# Patient Record
Sex: Male | Born: 2000 | Race: Black or African American | Hispanic: No | Marital: Single | Smoking: Never smoker
Health system: Southern US, Community
[De-identification: ages and names within clinical notes are randomized; demographics above are authoritative.]

---
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
## 2021-11-21 ENCOUNTER — Encounter (HOSPITAL_COMMUNITY): Payer: Self-pay | Admitting: Emergency Medicine

## 2021-11-21 ENCOUNTER — Emergency Department (HOSPITAL_COMMUNITY): Payer: Medicaid Other

## 2021-11-21 ENCOUNTER — Other Ambulatory Visit: Payer: Self-pay

## 2021-11-21 ENCOUNTER — Emergency Department (HOSPITAL_COMMUNITY)
Admission: EM | Admit: 2021-11-21 | Discharge: 2021-11-21 | Disposition: A | Discharge status: Home / Self Care | Payer: Medicaid Other | Attending: Emergency Medicine | Admitting: Emergency Medicine

## 2021-11-21 DIAGNOSIS — R1032 Left lower quadrant pain: Secondary | ICD-10-CM | POA: Diagnosis not present

## 2021-11-21 DIAGNOSIS — R319 Hematuria, unspecified: Secondary | ICD-10-CM | POA: Diagnosis present

## 2021-11-21 LAB — BASIC METABOLIC PANEL
Anion gap: 12 (ref 5–15)
BUN: 9 mg/dL (ref 6–20)
CO2: 24 mmol/L (ref 22–32)
Calcium: 9.8 mg/dL (ref 8.9–10.3)
Chloride: 103 mmol/L (ref 98–111)
Creatinine, Ser: 0.89 mg/dL (ref 0.61–1.24)
GFR, Estimated: >60 mL/min (ref >60)
Glucose, Bld: 101 mg/dL — ABNORMAL HIGH (ref 70–99)
Potassium: 3.7 mmol/L (ref 3.5–5.1)
Sodium: 139 mmol/L (ref 135–145)

## 2021-11-21 LAB — URINALYSIS, ROUTINE W REFLEX MICROSCOPIC
Bilirubin Urine: NEGATIVE
Glucose, UA: NEGATIVE mg/dL
Ketones, ur: 5 mg/dL — AB
Nitrite: NEGATIVE
Protein, ur: NEGATIVE mg/dL
Specific Gravity, Urine: 1.014 (ref 1.005–1.030)
WBC, UA: >50 WBC/hpf — ABNORMAL HIGH (ref 0–5)
pH: 6 (ref 5.0–8.0)

## 2021-11-21 LAB — CBC
HCT: 43.1 % (ref 39.0–52.0)
Hemoglobin: 14.9 g/dL (ref 13.0–17.0)
MCH: 29.9 pg (ref 26.0–34.0)
MCHC: 34.6 g/dL (ref 30.0–36.0)
MCV: 86.4 fL (ref 80.0–100.0)
Platelets: 184 10*3/uL (ref 150–400)
RBC: 4.99 MIL/uL (ref 4.22–5.81)
RDW: 12.8 % (ref 11.5–15.5)
WBC: 6.6 10*3/uL (ref 4.0–10.5)
nRBC: 0 % (ref 0.0–0.2)

## 2021-11-21 MED ORDER — CEPHALEXIN 500 MG PO CAPS: 500.0000 mg | ORAL_CAPSULE | Freq: Two times a day (BID) | ORAL | 0 refills | Status: AC

## 2021-11-21 NOTE — ED Notes (Signed)
Patient verbalizes understanding of d/c instructions. Opportunities for questions and answers were provided. Pt d/c from ED and ambulated to lobby.  

## 2021-11-21 NOTE — ED Triage Notes (Addendum)
Pt reported to ED with c/o blood in urine upon awakening this AM. States that shortly afterwards he developed pain in the left lower abdomen that radiates into back. Pt denies any nausea or vomiting, difficulty urinating, urinary frequency or burning. ?

## 2021-11-21 NOTE — Discharge Instructions (Addendum)
Please turn to the ED with any new or worsening signs or symptoms just fevers, inability to urinate, nausea, vomiting ?Please pick up antibiotics I have sent in for you ?Please follow-up with your PCP in the next week for evaluation ?In the event that you have blood in your urine again, please call the urology office that I referred you to.  Their contact information is located in this packet. ?Please read the attached informational guide concerning blood in your urine. ?

## 2021-11-21 NOTE — ED Provider Notes (Signed)
?[facility] ?Provider Note ? ? ?CSN: [id number] ?Arrival date & time: 11/21/21  1816 ? ?  ? ?History ? ?Chief Complaint  ?Patient presents with  ? Hematuria  ? ? ?Richard Medina is a 21 y.o. male with no medical history.  Patient presents to ED for evaluation of blood in urine.  Patient states that this morning he woke up, went to the bathroom to urinate and noticed at the end of his stream 3 drops of blood that came out.  Patient states that 15 minutes later he use the bathroom again and the same issue occurred.  Patient states that at this time he developed left lower quadrant pain.  The patient states the pain is known with her at present.  Patient describes her left lower quadrant pain as "lingering, ache, dull".  Patient is endorsing body aches, left-sided CVA tenderness, left lower quadrant abdominal pain. The patient denies fevers, nausea, vomiting, diarrhea, penile discharge, burning with urination. ? ? ? ? ? ?Hematuria ?Associated symptoms include abdominal pain.  ? ?  ? ?Home Medications ?Prior to Admission medications   ?Medication Sig Start Date End Date Taking? Authorizing Provider  ?cephALEXin (KEFLEX) 500 MG capsule Take 1 capsule (500 mg total) by mouth 2 (two) times daily. 11/21/21  Yes Al DecantGroce, Jacarri Gesner F, PA-C  ?acetaminophen (TYLENOL) 500 MG tablet Take 1 tablet (500 mg total) by mouth every 6 (six) hours as needed. 12/06/13   Piepenbrink, Victorino DikeJennifer, PA-C  ?ibuprofen (ADVIL,MOTRIN) 100 MG/5ML suspension Take 29.5 mLs (590 mg total) by mouth every 6 (six) hours as needed for mild pain. 11/23/14   Marcellina MillinGaley, Timothy, MD  ?triamcinolone cream (KENALOG) 0.1 % Apply 1 application topically 2 (two) times daily. X 5 days qs 09/11/14   Marcellina MillinGaley, Timothy, MD  ?   ? ?Allergies    ?Patient has no known allergies.   ? ?Review of Systems   ?Review of Systems  ?Constitutional:  Positive for chills. Negative for fever.  ?Gastrointestinal:  Positive for abdominal pain. Negative for  nausea and vomiting.  ?Genitourinary:  Positive for flank pain and hematuria. Negative for dysuria and penile discharge.  ?Musculoskeletal:  Positive for back pain.  ?All other systems reviewed and are negative. ? ?Physical Exam ?Updated Vital Signs ?BP 138/74   Pulse 65   Temp 99.5 ?F (37.5 ?C) (Oral)   Resp 12   SpO2 97%  ?Physical Exam ?Vitals and nursing note reviewed.  ?Constitutional:   ?   General: He is not in acute distress. ?   Appearance: Normal appearance. He is not ill-appearing, toxic-appearing or diaphoretic.  ?HENT:  ?   Head: Normocephalic and atraumatic.  ?   Mouth/Throat:  ?   Mouth: Mucous membranes are moist.  ?   Pharynx: Oropharynx is clear.  ?Eyes:  ?   Extraocular Movements: Extraocular movements intact.  ?   Conjunctiva/sclera: Conjunctivae normal.  ?   Pupils: Pupils are equal, round, and reactive to light.  ?Cardiovascular:  ?   Rate and Rhythm: Normal rate and regular rhythm.  ?Pulmonary:  ?   Effort: Pulmonary effort is normal.  ?   Breath sounds: Normal breath sounds. No wheezing.  ?Abdominal:  ?   General: Abdomen is flat.  ?   Palpations: Abdomen is soft.  ?   Tenderness: There is abdominal tenderness (LLQ). There is left CVA tenderness.  ?Musculoskeletal:  ?   Cervical back: Normal range of motion and neck supple. No tenderness.  ?Skin: ?  General: Skin is warm and dry.  ?   Capillary Refill: Capillary refill takes less than 2 seconds.  ?Neurological:  ?   Mental Status: He is alert and oriented to person, place, and time.  ? ? ?ED Results / Procedures / Treatments   ?Labs ?(all labs ordered are listed, but only abnormal results are displayed) ?Labs Reviewed  ?BASIC METABOLIC PANEL - Abnormal; Notable for the following components:  ?    Result Value  ? Glucose, Bld 101 (*)   ? All other components within normal limits  ?URINALYSIS, ROUTINE W REFLEX MICROSCOPIC - Abnormal; Notable for the following components:  ? APPearance HAZY (*)   ? Hgb urine dipstick SMALL (*)   ? Ketones,  ur 5 (*)   ? Leukocytes,Ua MODERATE (*)   ? WBC, UA >50 (*)   ? Bacteria, UA FEW (*)   ? All other components within normal limits  ?URINE CULTURE  ?CBC  ? ? ?EKG ?None ? ?Radiology ?CT Renal Stone Study ? ?Result Date: 11/21/2021 ?CLINICAL DATA:  Nephrolithiasis. EXAM: CT ABDOMEN AND PELVIS WITHOUT CONTRAST TECHNIQUE: Multidetector CT imaging of the abdomen and pelvis was performed following the standard protocol without IV contrast. RADIATION DOSE REDUCTION: This exam was performed according to the departmental dose-optimization program which includes automated exposure control, adjustment of the mA and/or kV according to patient size and/or use of iterative reconstruction technique. COMPARISON:  None. FINDINGS: Lower chest: No acute abnormality. Hepatobiliary: No focal liver abnormality is seen. No gallstones, gallbladder wall thickening, or biliary dilatation. Pancreas: Unremarkable. No pancreatic ductal dilatation or surrounding inflammatory changes. Spleen: Normal in size without focal abnormality. Adrenals/Urinary Tract: Adrenal glands are unremarkable. Kidneys are normal, without renal calculi, focal lesion, or hydronephrosis. Bladder is unremarkable. Stomach/Bowel: Stomach is within normal limits. Appendix appears normal. No evidence of bowel wall thickening, distention, or inflammatory changes. Vascular/Lymphatic: No significant vascular findings are present. No enlarged abdominal or pelvic lymph nodes. Reproductive: Prostate is unremarkable. Other: No abdominal wall hernia or abnormality. No abdominopelvic ascites. Musculoskeletal: No acute or significant osseous findings. IMPRESSION: 1. No evidence for urinary tract calculus or hydronephrosis. Electronically Signed   By: Darliss Cheney M.D.   On: 11/21/2021 20:49   ? ?Procedures ?Procedures  ? ?Medications Ordered in ED ?Medications - No data to display ? ?ED Course/ Medical Decision Making/ A&P ?  ?                        ?Medical Decision Making ?Amount  and/or Complexity of Data Reviewed ?Labs: ordered. ? ? ?77 oh male presents ED for evaluation of hematuria.  Please see HPI for further details. ? ?On examination, the patient is afebrile, nontachycardic, not hypoxic.  Patient lung sounds are clear bilaterally.  Patient abdomen is soft, compressible.  Patient endorses left lower quadrant abdominal pain.  Patient also has left CVA tenderness.  Patient nontoxic appearance ? ?Patient worked up utilizing the following labs and imaging studies interpreted by me personally: ?- CBC unremarkable ?- BMP unremarkable, noncontributory to symptoms ?- Urinalysis shows small hemoglobin, ketones, leukocytes, bacteria.  Patient will be placed on Keflex twice daily for 7 days. ?- CT renal stone study does not show any sign of nephrolithiasis, hydronephrosis, pyelonephritis. ? ?Most likely cause of patient symptoms at this time seems to be possibly passed kidney stone.  I discussed the patient his case with my attending Dr. Lockie Mola who believes that this is a strong theory.  We will advise  the patient to take antibiotic therapy for the next 7 days and also follow-up with urology if the situation occurs again.  Patient provided with return precautions and he voiced understanding.  Patient had all his questions answered to satisfaction. ? ?Patient stable for discharge at this time. ? ? ?Final Clinical Impression(s) / ED Diagnoses ?Final diagnoses:  ?Hematuria, unspecified type  ? ? ?Rx / DC Orders ?ED Discharge Orders   ? ?      Ordered  ?  cephALEXin (KEFLEX) 500 MG capsule  2 times daily       ? 11/21/21 2223  ? ?  ?  ? ?  ? ? ?  ?Al Decant, PA-C ?11/21/21 2223 ? ?  ?Virgina Norfolk, DO ?11/21/21 2336 ? ?

## 2021-11-21 NOTE — ED Provider Triage Note (Signed)
Emergency Medicine Provider Triage Evaluation Note ? ?Caroline More , a 21 y.o. male  was evaluated in triage.  Pt complains of hematuria.  Reports multiple episodes today.  Also endorses left-sided flank pain as of today.  Denies fever, chills, or dysuria.  Denies abdominal pain.  Denies testicular pain. ? ?Review of Systems  ?Positive: As above ?Negative: As above ? ?Physical Exam  ?BP (!) 163/71 (BP Location: Right Arm)   Pulse 85   Temp 99.5 ?F (37.5 ?C) (Oral)   Resp 12   SpO2 100%  ?Gen:   Awake, no distress   ?Resp:  Normal effort  ?MSK:   Moves extremities without difficulty  ?Other:   ? ?Medical Decision Making  ?Medically screening exam initiated at 7:12 PM.  Appropriate orders placed.  Gionni Riese was informed that the remainder of the evaluation will be completed by another provider, this initial triage assessment does not replace that evaluation, and the importance of remaining in the ED until their evaluation is complete. ? ? ?  ?Marita Kansas, PA-C ?11/21/21 1913 ? ?

## 2021-11-23 LAB — URINE CULTURE: Culture: NO GROWTH

## 2022-02-05 ENCOUNTER — Emergency Department (HOSPITAL_COMMUNITY)
Admission: EM | Admit: 2022-02-05 | Discharge: 2022-02-05 | Disposition: A | Discharge status: Home / Self Care | Payer: Medicaid Other | Attending: Emergency Medicine | Admitting: Emergency Medicine

## 2022-02-05 ENCOUNTER — Encounter (HOSPITAL_COMMUNITY): Payer: Self-pay

## 2022-02-05 ENCOUNTER — Other Ambulatory Visit: Payer: Self-pay

## 2022-02-05 DIAGNOSIS — R197 Diarrhea, unspecified: Secondary | ICD-10-CM | POA: Diagnosis not present

## 2022-02-05 DIAGNOSIS — R1084 Generalized abdominal pain: Secondary | ICD-10-CM | POA: Insufficient documentation

## 2022-02-05 NOTE — ED Provider Notes (Signed)
[facility] EMERGENCY DEPARTMENT Provider Note   CSN: [id number] Arrival date & time: 02/05/22  1639     History  Chief Complaint  Patient presents with   Abdominal Pain    [facility] is a 21 y.o. male.  Patient presents to the hospital complaining of abdominal pain and diarrhea that began this morning.  Patient states that he ate a large order of ghost pepper hot wings last night.  He woke up this morning with symptoms.  He has eaten the same wings before but to a much smaller quantity.  Denies nausea, vomiting, chest pain, shortness of breath  HPI     Home Medications Prior to Admission medications   Medication Sig Start Date End Date Taking? Authorizing Provider  acetaminophen (TYLENOL) 500 MG tablet Take 1 tablet (500 mg total) by mouth every 6 (six) hours as needed. 12/06/13   Piepenbrink, Victorino Dike, PA-C  cephALEXin (KEFLEX) 500 MG capsule Take 1 capsule (500 mg total) by mouth 2 (two) times daily. 11/21/21   Al Decant, PA-C  ibuprofen (ADVIL,MOTRIN) 100 MG/5ML suspension Take 29.5 mLs (590 mg total) by mouth every 6 (six) hours as needed for mild pain. 11/23/14   Marcellina Millin, MD  triamcinolone cream (KENALOG) 0.1 % Apply 1 application topically 2 (two) times daily. X 5 days qs 09/11/14   Marcellina Millin, MD      Allergies    Patient has no known allergies.    Review of Systems   Review of Systems  Respiratory:  Negative for shortness of breath.   Cardiovascular:  Negative for chest pain.  Gastrointestinal:  Positive for abdominal pain and diarrhea.   Physical Exam Updated Vital Signs BP 132/85 (BP Location: Right Arm)   Pulse 86   Temp 98.5 F (36.9 C) (Oral)   Resp 16   Ht 6\' 3"  (1.905 m)   Wt 77.1 kg   SpO2 98%   BMI 21.25 kg/m  Physical Exam Vitals and nursing note reviewed.  Constitutional:      General: He is not in acute distress. HENT:     Head: Normocephalic and atraumatic.  Eyes:     Extraocular Movements:  Extraocular movements intact.  Cardiovascular:     Rate and Rhythm: Normal rate and regular rhythm.  Pulmonary:     Effort: Pulmonary effort is normal.     Breath sounds: Normal breath sounds.  Abdominal:     General: Abdomen is flat.     Palpations: Abdomen is soft.     Tenderness: There is no abdominal tenderness.  Skin:    General: Skin is warm and dry.  Neurological:     Mental Status: He is alert.    ED Results / Procedures / Treatments   Labs (all labs ordered are listed, but only abnormal results are displayed) Labs Reviewed  CBC WITH DIFFERENTIAL/PLATELET  COMPREHENSIVE METABOLIC PANEL  LIPASE, BLOOD  URINALYSIS, ROUTINE W REFLEX MICROSCOPIC    EKG None  Radiology No results found.  Procedures Procedures    Medications Ordered in ED Medications - No data to display  ED Course/ Medical Decision Making/ A&P                           Medical Decision Making Amount and/or Complexity of Data Reviewed Labs: ordered.   The patient presents with abdominal pain and diarrhea.  During work-up patient states that he feels that he is starting to improve and does  not feel that he needs further evaluation in the emergent setting.  This is reasonable based on history as the patient probably irritated his stomach lining with the spicy wings.  He may use over-the-counter medications such as Mylanta, Pepto-Bismol for symptom control.  He may return to work.  Follow-up as needed or if symptoms worsen  Final Clinical Impression(s) / ED Diagnoses Final diagnoses:  Generalized abdominal pain  Diarrhea, unspecified type    Rx / DC Orders ED Discharge Orders     None         Pamala Duffel 02/05/22 Chana Bode, MD 02/05/22 2126

## 2022-02-05 NOTE — ED Triage Notes (Signed)
Reports ate ghost pepper wings last night and woke up this am with diarrhea, stomach cramping. Diarrhea x 4

## 2022-02-05 NOTE — Discharge Instructions (Signed)
You were seen today for abdominal pain and diarrhea.  I recommend avoiding significantly spicy foods.  You may take Pepto-Bismol or Mylanta for symptom control.  If your pain worsens recommend follow-up with primary care

## 2022-10-08 ENCOUNTER — Emergency Department (HOSPITAL_COMMUNITY)
Admission: EM | Admit: 2022-10-08 | Discharge: 2022-10-08 | Disposition: A | Discharge status: Home / Self Care | Payer: Medicaid Other | Attending: Emergency Medicine | Admitting: Emergency Medicine

## 2022-10-08 ENCOUNTER — Other Ambulatory Visit: Payer: Self-pay

## 2022-10-08 ENCOUNTER — Encounter (HOSPITAL_COMMUNITY): Payer: Self-pay

## 2022-10-08 DIAGNOSIS — Z20822 Contact with and (suspected) exposure to covid-19: Secondary | ICD-10-CM | POA: Insufficient documentation

## 2022-10-08 DIAGNOSIS — R509 Fever, unspecified: Secondary | ICD-10-CM | POA: Diagnosis present

## 2022-10-08 DIAGNOSIS — J101 Influenza due to other identified influenza virus with other respiratory manifestations: Secondary | ICD-10-CM | POA: Diagnosis not present

## 2022-10-08 DIAGNOSIS — J111 Influenza due to unidentified influenza virus with other respiratory manifestations: Secondary | ICD-10-CM

## 2022-10-08 LAB — RESP PANEL BY RT-PCR (RSV, FLU A&B, COVID)  RVPGX2
Influenza A by PCR: POSITIVE — AB
Influenza B by PCR: NEGATIVE
Resp Syncytial Virus by PCR: NEGATIVE
SARS Coronavirus 2 by RT PCR: NEGATIVE

## 2022-10-08 MED ORDER — ONDANSETRON HCL 4 MG PO TABS: 4.0000 mg | ORAL_TABLET | Freq: Four times a day (QID) | ORAL | 0 refills | Status: AC

## 2022-10-08 NOTE — ED Provider Notes (Signed)
[place] [facility] Provider Note   CSN: [id number] Arrival date & time: 10/08/22  1217     History  Chief Complaint  Patient presents with   Flu-like s/s    [facility] is a 22 y.o. male with noncontributory past medical history presents with 2 days of flulike symptoms.  He endorses fever, chills, runny nose, watery eyes.  He reports he has been coughing up some yellow mucus.  He reports some very minor difficulty breathing, reports no shortness of breath when I am speaking with him, reports more that he feels congested and difficult to take a deep breath.  He denies any chest pain.  He denies significant nausea, vomiting.  He denies any other pain at this time.  He denies any known sick contacts.  HPI     Home Medications Prior to Admission medications   Medication Sig Start Date End Date Taking? Authorizing Provider  ondansetron (ZOFRAN) 4 MG tablet Take 1 tablet (4 mg total) by mouth every 6 (six) hours. 10/08/22  Yes Johonna Binette H, PA-C  acetaminophen (TYLENOL) 500 MG tablet Take 1 tablet (500 mg total) by mouth every 6 (six) hours as needed. 12/06/13   Piepenbrink, Anderson Malta, PA-C  cephALEXin (KEFLEX) 500 MG capsule Take 1 capsule (500 mg total) by mouth 2 (two) times daily. 11/21/21   Azucena Cecil, PA-C  ibuprofen (ADVIL,MOTRIN) 100 MG/5ML suspension Take 29.5 mLs (590 mg total) by mouth every 6 (six) hours as needed for mild pain. 11/23/14   Isaac Bliss, MD  triamcinolone cream (KENALOG) 0.1 % Apply 1 application topically 2 (two) times daily. X 5 days qs 09/11/14   Isaac Bliss, MD      Allergies    Patient has no known allergies.    Review of Systems   Review of Systems  All other systems reviewed and are negative.   Physical Exam Updated Vital Signs BP (!) 142/85   Pulse 80   Temp 98.4 F (36.9 C)   Resp 16   SpO2 100%  Physical Exam Vitals and nursing note reviewed.  Constitutional:      General: He  is not in acute distress.    Appearance: Normal appearance.  HENT:     Head: Normocephalic and atraumatic.  Eyes:     General:        Right eye: No discharge.        Left eye: No discharge.  Cardiovascular:     Rate and Rhythm: Normal rate and regular rhythm.  Pulmonary:     Effort: Pulmonary effort is normal. No respiratory distress.     Comments: No wheezing, rhonchi, stridor, rales, no respiratory distress Musculoskeletal:        General: No deformity.  Skin:    General: Skin is warm and dry.  Neurological:     Mental Status: He is alert and oriented to person, place, and time.  Psychiatric:        Mood and Affect: Mood normal.        Behavior: Behavior normal.     ED Results / Procedures / Treatments   Labs (all labs ordered are listed, but only abnormal results are displayed) Labs Reviewed  RESP PANEL BY RT-PCR (RSV, FLU A&B, COVID)  RVPGX2 - Abnormal; Notable for the following components:      Result Value   Influenza A by PCR POSITIVE (*)    All other components within normal limits    EKG None  Radiology No results found.  Procedures Procedures    Medications Ordered in ED Medications - No data to display  ED Course/ Medical Decision Making/ A&P                             Medical Decision Making Risk Prescription drug management.   This is a well-appearing 22yo male who presents with concern for 2 days of cough, fever, headache.  My emergent differential diagnosis includes acute upper respiratory infection with COVID, flu, RSV versus new asthma presentation, acute bronchitis, less clinical concern for pneumonia.  Also considered other ENT emergencies, Ludwig angina, strep pharyngitis, mono, versus epiglottis, tonsillitis versus other.  This is not an exhaustive differential.  On my exam patient is overall well-appearing, they have temperature of 98.4, breathing unlabored, no tachypnea, no respiratory distress, stable oxygen saturation.  Patient  without tachycardia. RVP independently reviewed by myself shows positive for Flu A.  Patient symptoms are consistent with the flu  Encouraged ibuprofen, Tylenol, rest, plenty of fluids. Discharged with PRN Zofran. Discussed extensive return precautions.  Patient discharged in stable condition at this time.  Final Clinical Impression(s) / ED Diagnoses Final diagnoses:  Flu    Rx / DC Orders ED Discharge Orders          Ordered    ondansetron (ZOFRAN) 4 MG tablet  Every 6 hours        10/08/22 1352              Keilana Morlock, Joesph Fillers, PA-C 10/08/22 1407    Carmin Muskrat, MD 10/08/22 1708

## 2022-10-08 NOTE — ED Triage Notes (Signed)
Pt came in POV for flu-like s/s the past two days. Endorses fever, chills, runny nose, watery eyes, coughing up yellow mucus & a small amount of difficulty breathing. A/Ox4, denies pain at this time.

## 2022-10-08 NOTE — Discharge Instructions (Signed)
You have the flu this is a viral infection that will likely start to improve after 7-10 days, antibiotics are not helpful in treating viral infections. You may use Zofran as needed for nausea. Please make sure you are drinking plenty of fluids. You can treat your symptoms supportively with tylenol 650 mg/1000mg and ibuprofen 600 mg every 6 hours for fevers and pains. For nasal congestion you can use Zyrtec and Flonase to help with nasal congestion. To treat cough you can use over the counter cough medications such as Mucinex DM or Robitussin and throat lozenges. If your symptoms are not improving please follow up with you Primary doctor.   If you develop persistent fevers, shortness of breath or difficulty breathing, chest pain, severe headache and neck pain, persistent nausea and vomiting or other new or concerning symptoms return to the Emergency department.  

## 2023-01-06 IMAGING — CT CT RENAL STONE PROTOCOL
2 of 4 series · 16 of 46 positions shown, 18 images · non-contrast
Comparison: None.

CLINICAL DATA: Nephrolithiasis.



[Series 3: stone study 5.0 i30f 2 · axial · 0.72mm/px · z∈[-454,-64]mm · 13 of 86 slices shown, 15 images]
[im 4/86  soft-tissue]
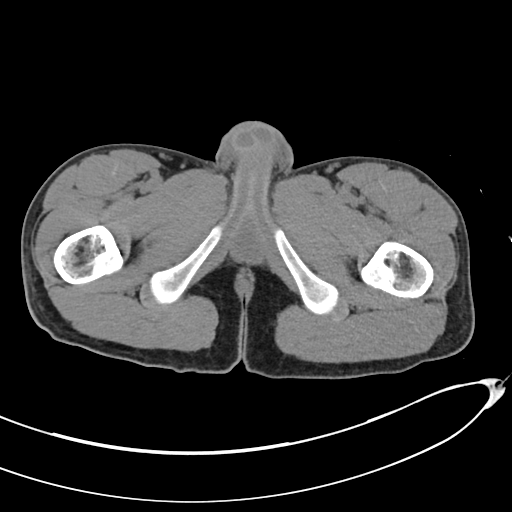
[im 4/86  bone]
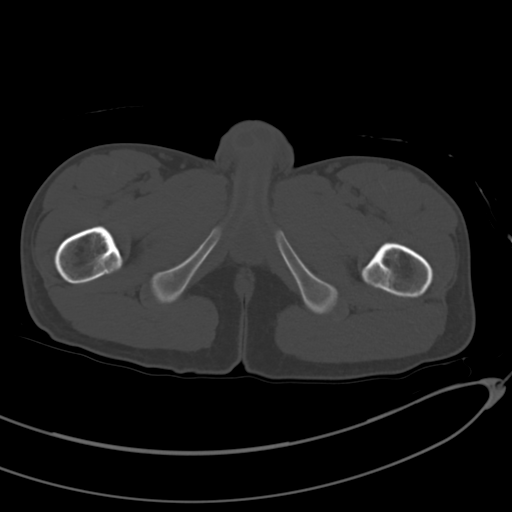
[im 11/86  soft-tissue]
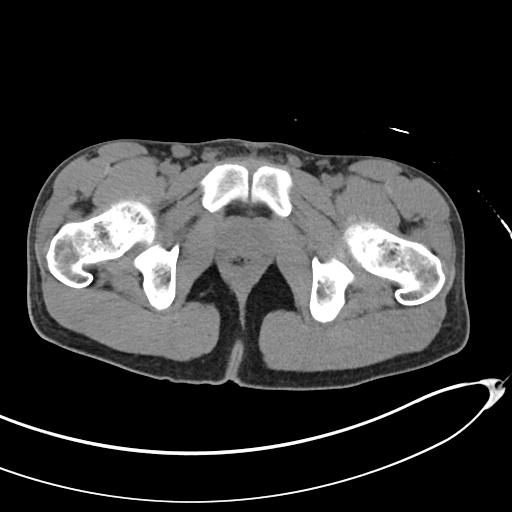
[im 18/86  soft-tissue]
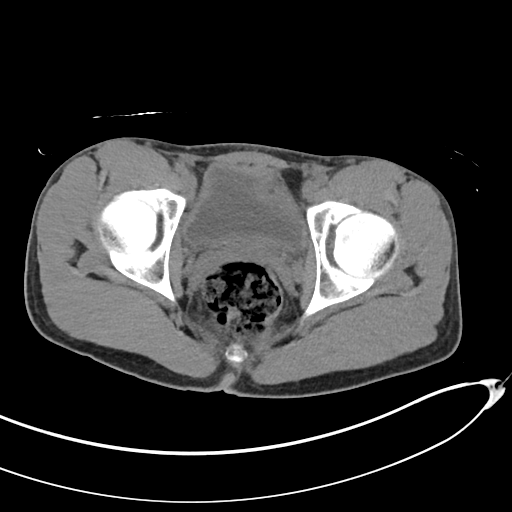
[im 25/86  soft-tissue]
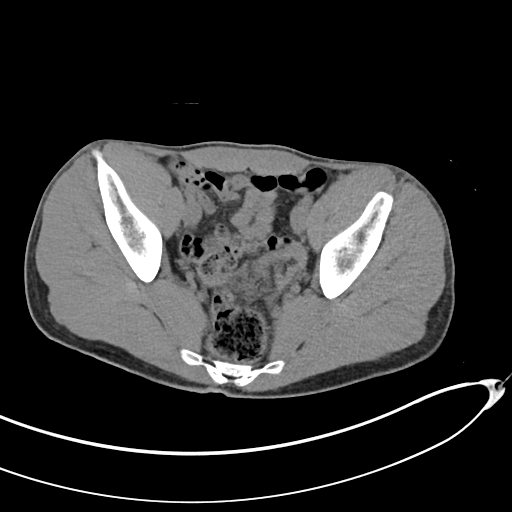
[im 29/86  soft-tissue]
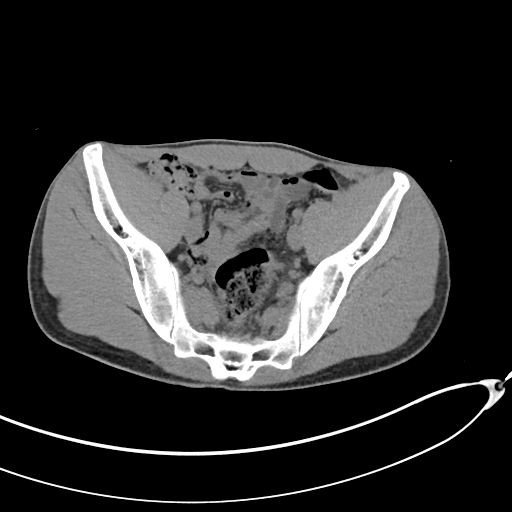
[im 36/86  soft-tissue]
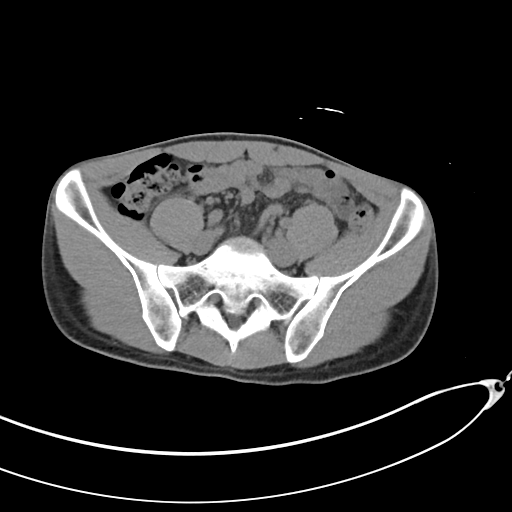
[im 43/86  soft-tissue]
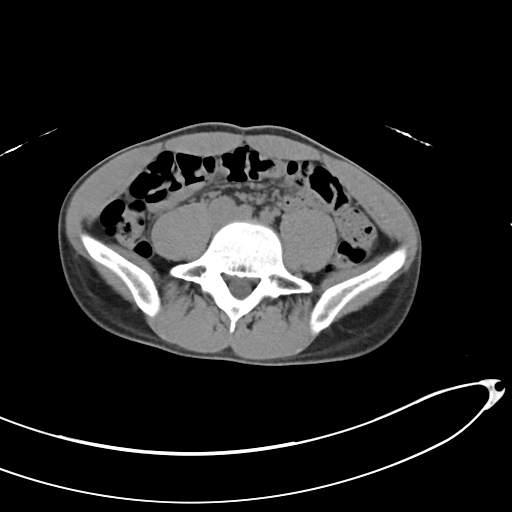
[im 50/86  soft-tissue]
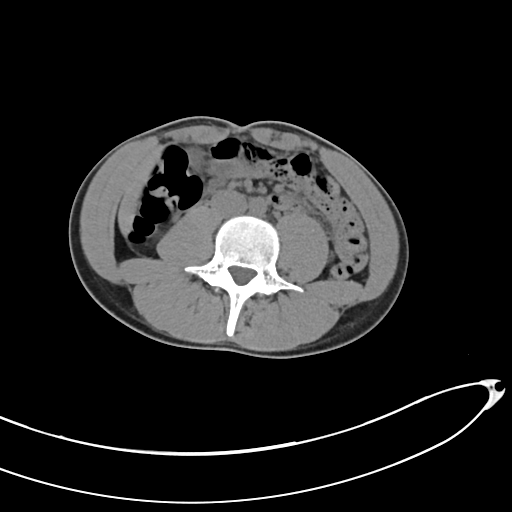
[im 57/86  soft-tissue]
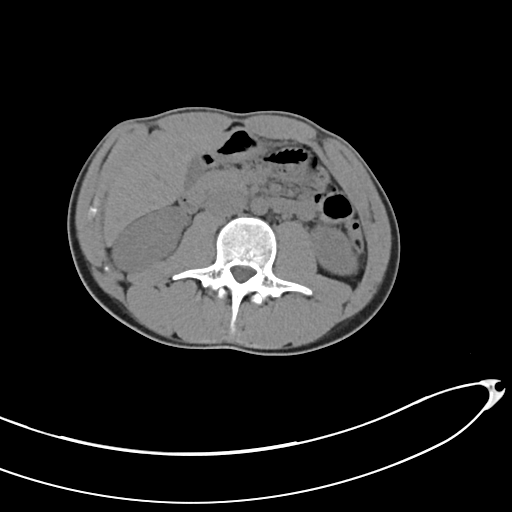
[im 57/86  bone]
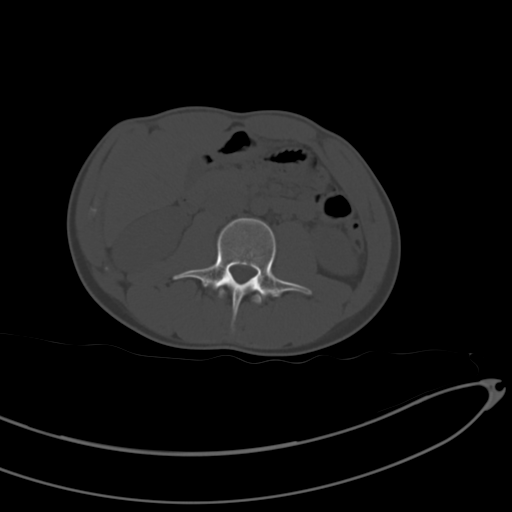
[im 61/86  soft-tissue]
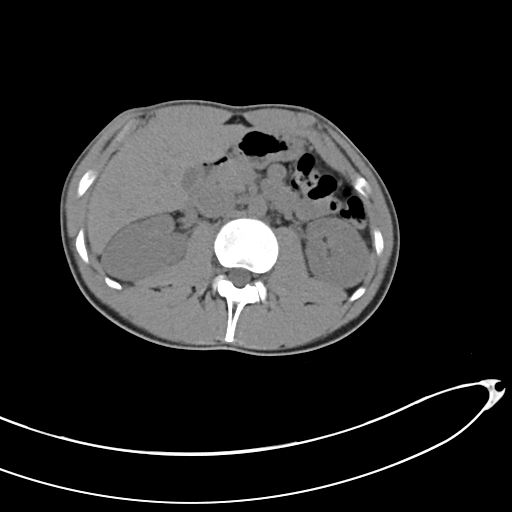
[im 68/86  soft-tissue]
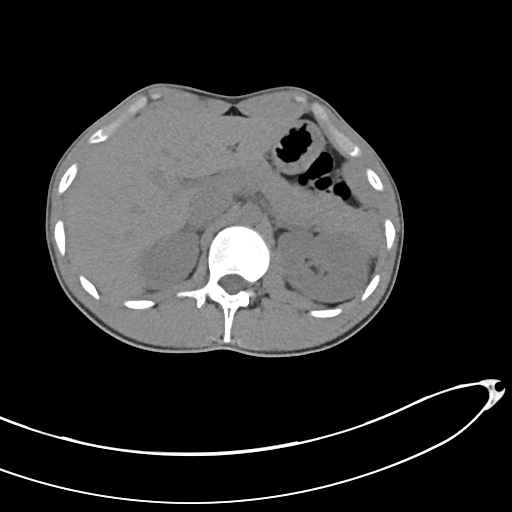
[im 75/86  soft-tissue]
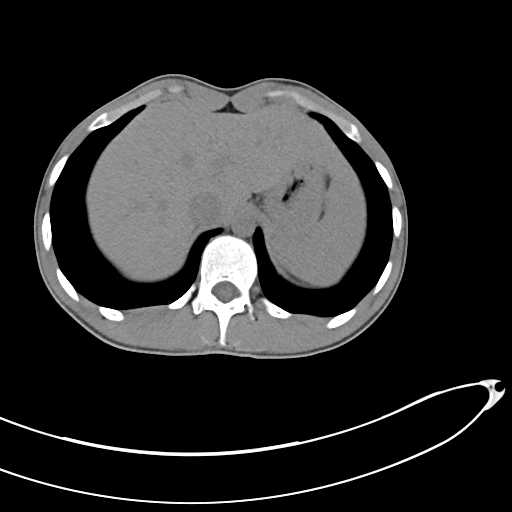
[im 82/86  soft-tissue]
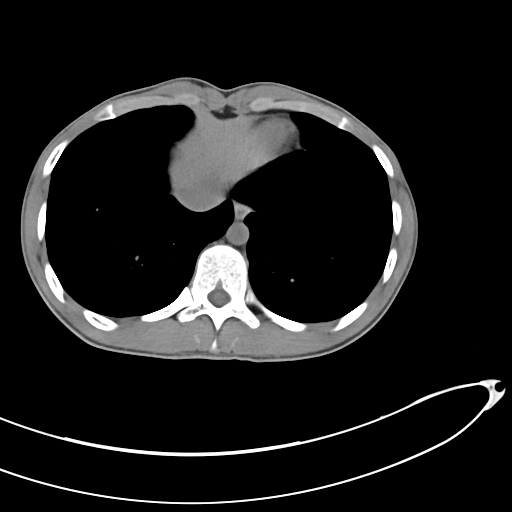

[Series 6: coronal soft tissue · coronal · 0.76mm/px · 3 of 87 slices shown]
[im 29/87  soft-tissue]
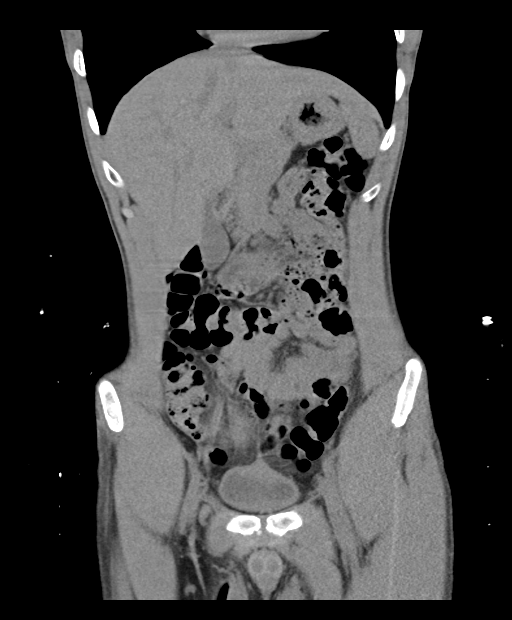
[im 39/87  soft-tissue]
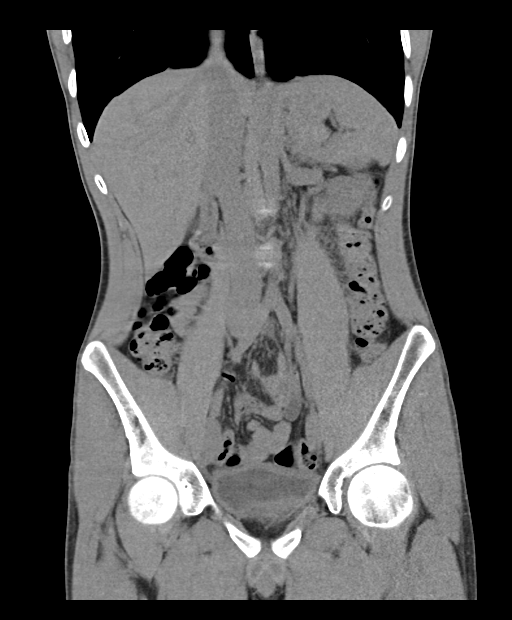
[im 48/87  soft-tissue]
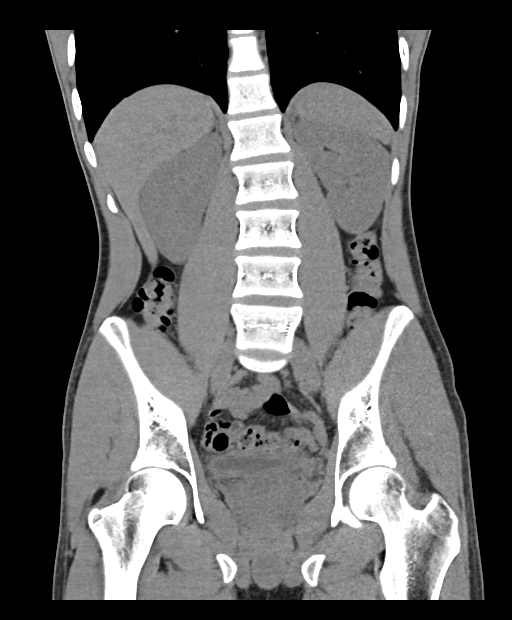

[16 of 46 positions shown; findings below may reference images not displayed]

FINDINGS: Lower chest: No acute abnormality.

Hepatobiliary: No focal liver abnormality is seen. No gallstones,
gallbladder wall thickening, or biliary dilatation.

Pancreas: Unremarkable. No pancreatic ductal dilatation or
surrounding inflammatory changes.

Spleen: Normal in size without focal abnormality.

Adrenals/Urinary Tract: Adrenal glands are unremarkable. Kidneys are
normal, without renal calculi, focal lesion, or hydronephrosis.
Bladder is unremarkable.

Stomach/Bowel: Stomach is within normal limits. Appendix appears
normal. No evidence of bowel wall thickening, distention, or
inflammatory changes.

Vascular/Lymphatic: No significant vascular findings are present. No
enlarged abdominal or pelvic lymph nodes.

Reproductive: Prostate is unremarkable.

Other: No abdominal wall hernia or abnormality. No abdominopelvic
ascites.

Musculoskeletal: No acute or significant osseous findings.
IMPRESSION: 1. No evidence for urinary tract calculus or hydronephrosis.
